# Patient Record
Sex: Male | Born: 2000 | Race: Black or African American | Hispanic: No | Marital: Single | Smoking: Former smoker
Health system: Southern US, Community
[De-identification: ages and names within clinical notes are randomized; demographics above are authoritative.]

---
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
## 2020-11-07 ENCOUNTER — Other Ambulatory Visit: Payer: Self-pay

## 2020-11-07 ENCOUNTER — Encounter: Payer: Self-pay | Admitting: Plastic Surgery

## 2020-11-07 ENCOUNTER — Ambulatory Visit (INDEPENDENT_AMBULATORY_CARE_PROVIDER_SITE_OTHER): Payer: BC Managed Care – PPO | Admitting: Plastic Surgery

## 2020-11-07 VITALS — BP 138/86 | HR 49 | Ht 68.0 in | Wt 182.0 lb

## 2020-11-07 DIAGNOSIS — L989 Disorder of the skin and subcutaneous tissue, unspecified: Secondary | ICD-10-CM

## 2020-11-07 NOTE — Progress Notes (Signed)
   Referring Provider Gary Ripple, MD [address],  [place] [address]   CC:  Chief Complaint  Patient presents with  . Advice Only      Gary Gould is an 20 y.o. male.  HPI: Patient presents to discuss a cystic lesion on his forehead.  He had felt small nodule in his central glabellar area for several months.  It spontaneously grew much larger and subsequently regressed.  There is some superficial skin changes and is interested in having it excised.  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  They felt that there was some persistent swelling after the and another plastic surgeon evaluated him and did scar excision but did not detect any evidence of lipoma recurrence.  There bothered by the scar and the swelling in that area as well  No Known Allergies  No outpatient encounter medications on file as of 11/07/2020.   No facility-administered encounter medications on file as of 11/07/2020.     No past medical history on file.  No family history on file.  Social History   Social History Narrative  . Not on file     Review of Systems General: Denies fevers, chills, weight loss CV: Denies chest pain, shortness of breath, palpitations  Physical Exam Vitals with BMI 11/07/2020  Height 5\' 8"   Weight 182 lbs  BMI 70.62  Systolic 376  Diastolic 86  Pulse 49    General:  No acute distress,  Alert and oriented, Non-Toxic, Normal speech and affect Examination shows 2-3 small nodules in the area of the glabella.  These are between a half a centimeter and a centimeter in size.  The overlying skin is very mildly erythematous and slightly atrophied.  They are not tender and I do not feel any fluctuance.  The right upper forehead there is a transverse scar that has widened.  4 mm.  There is a very mild surrounding swelling or prominence that extends at least 4 cm in diameter but this is fairly subtle.  Assessment/Plan I suspect he has  a cystic lesion in the glabella.  We discussed excision of this.  I recommended waiting another 4 to 6 weeks and hopefully there would be a little bit of additional regression which would make his scar smaller.  We discussed the risks include bleeding, infection, damage to surrounding structures and need for additional procedures.  We discussed potential for recurrence and the location and orientation of the scar.  Regarding the right upper forehead I went over some options of scar revision and dermabrasion and they are going to think about these.  Gary Gould 11/07/2020, 9:29 AM

## 2020-12-05 ENCOUNTER — Ambulatory Visit: Payer: BC Managed Care – PPO | Admitting: Plastic Surgery

## 2021-01-09 ENCOUNTER — Ambulatory Visit: Payer: BC Managed Care – PPO | Admitting: Plastic Surgery

## 2021-05-18 ENCOUNTER — Emergency Department (HOSPITAL_COMMUNITY): Payer: BC Managed Care – PPO

## 2021-05-18 ENCOUNTER — Other Ambulatory Visit: Payer: Self-pay

## 2021-05-18 ENCOUNTER — Encounter (HOSPITAL_COMMUNITY): Payer: Self-pay | Admitting: *Deleted

## 2021-05-18 ENCOUNTER — Emergency Department (HOSPITAL_COMMUNITY)
Admission: EM | Admit: 2021-05-18 | Discharge: 2021-05-18 | Disposition: A | Discharge status: Home / Self Care | Payer: BC Managed Care – PPO | Attending: Emergency Medicine | Admitting: Emergency Medicine

## 2021-05-18 DIAGNOSIS — F410 Panic disorder [episodic paroxysmal anxiety] without agoraphobia: Secondary | ICD-10-CM | POA: Insufficient documentation

## 2021-05-18 MED ORDER — LACTATED RINGERS IV BOLUS
1000.0000 mL | Freq: Once | INTRAVENOUS | Status: AC
  Administered 2021-05-18: 1000 mL via INTRAVENOUS

## 2021-05-18 NOTE — Discharge Instructions (Addendum)
Please follow-up with your PCP

## 2021-05-18 NOTE — ED Provider Notes (Signed)
[facility] EMERGENCY DEPARTMENT Provider Note   CSN: [id number] Arrival date & time: 05/18/21  1936     History Chief Complaint  Patient presents with   Panic Attack    Gary Gould is a 20 y.o. male.  HPI  Patient is a 20 year old male that is presenting for a panic attack. Patient endorses eating an edible two hours ago. He states that it was a rice krispy with THC that was made by one of his friends. He says that he went to dinner and became anxious. He left dinner and on his ride home began to get worried about eating the edible. He states that he felt like his heart was racing and he became short of breath. He became very anxious and called EMS. He states that this has not happened before. He denies any other drug use.   Patient denies any fevers, chills, headache, neck stiffness, chest pain, shortness of breath, nausea, vomiting, diarrhea, numbness or weakness.      History reviewed. No pertinent past medical history.  There are no problems to display for this patient.   History reviewed. No pertinent surgical history.     No family history on file.  Social History   Tobacco Use   Smoking status: Never   Smokeless tobacco: Never  Substance Use Topics   Alcohol use: Yes   Drug use: Yes    Types: Marijuana    Home Medications Prior to Admission medications   Medication Sig Start Date End Date Taking? Authorizing Provider  acetaminophen (TYLENOL) 325 MG tablet Take 650 mg by mouth every 6 (six) hours as needed for moderate pain or headache.   Yes [provider]  Multiple Vitamin (MULTIVITAMIN) tablet Take 1 tablet by mouth daily.   Yes [provider]  amoxicillin-clavulanate (AUGMENTIN) 875-125 MG tablet Take 1 tablet by mouth See admin instructions. Bid x 10 days Patient not taking: Reported on 05/18/2021 05/06/21   [provider]  ciprofloxacin-dexamethasone (CIPRODEX) OTIC suspension Place 2 drops into the  right ear See admin instructions. Bid x 7 days Patient not taking: Reported on 05/18/2021 05/07/21   [provider]    Allergies    Patient has no known allergies.  Review of Systems   Review of Systems  Constitutional:  Negative for chills, diaphoresis, fatigue and fever.  HENT:  Negative for congestion, dental problem, ear pain, facial swelling, hearing loss, nosebleeds, postnasal drip, rhinorrhea, sore throat and trouble swallowing.   Eyes:  Negative for photophobia, pain and visual disturbance.  Respiratory:  Negative for apnea, cough, choking, chest tightness, shortness of breath, wheezing and stridor.   Cardiovascular:  Negative for chest pain, palpitations and leg swelling.  Gastrointestinal:  Negative for abdominal distention, abdominal pain, constipation, diarrhea, nausea and vomiting.  Endocrine: Negative for polydipsia and polyuria.  Genitourinary:  Negative for difficulty urinating, dysuria, flank pain, frequency, hematuria and urgency.  Musculoskeletal:  Negative for gait problem, myalgias, neck pain and neck stiffness.  Skin:  Negative for rash and wound.  Allergic/Immunologic: Negative for environmental allergies and food allergies.  Neurological:  Negative for dizziness, tremors, seizures, syncope, facial asymmetry, speech difficulty, light-headedness, numbness and headaches.  Psychiatric/Behavioral:  Negative for behavioral problems and confusion. The patient is nervous/anxious.   All other systems reviewed and are negative.  Physical Exam Updated Vital Signs BP (!) 145/70 (BP Location: Right Arm)   Pulse 76   Temp 97.9 F (36.6 C)   Resp 18  Ht '5\' 8"'$  (1.727 m)   Wt 82.6 kg   SpO2 99%   BMI 27.69 kg/m   Physical Exam Vitals and nursing note reviewed.  Constitutional:      General: He is not in acute distress.    Appearance: Normal appearance. He is normal weight.  HENT:     Head: Normocephalic and atraumatic.     Right Ear: External ear normal.      Left Ear: External ear normal.     Nose: Nose normal. No congestion.     Mouth/Throat:     Mouth: Mucous membranes are moist.     Pharynx: Oropharynx is clear. No oropharyngeal exudate or posterior oropharyngeal erythema.  Eyes:     General: No visual field deficit.    Extraocular Movements: Extraocular movements intact.     Conjunctiva/sclera: Conjunctivae normal.     Pupils: Pupils are equal, round, and reactive to light.  Cardiovascular:     Rate and Rhythm: Normal rate and regular rhythm.     Pulses: Normal pulses.     Heart sounds: Normal heart sounds. No murmur heard.   No friction rub. No gallop.  Pulmonary:     Effort: Pulmonary effort is normal. No respiratory distress.     Breath sounds: Normal breath sounds. No stridor. No wheezing, rhonchi or rales.  Chest:     Chest wall: No tenderness.  Abdominal:     General: Abdomen is flat. Bowel sounds are normal. There is no distension.     Palpations: Abdomen is soft.     Tenderness: There is no abdominal tenderness. There is no right CVA tenderness, left CVA tenderness, guarding or rebound.  Musculoskeletal:        General: No swelling or tenderness. Normal range of motion.     Cervical back: Normal range of motion and neck supple. No rigidity, tenderness or bony tenderness.     Thoracic back: Normal. No tenderness or bony tenderness.     Lumbar back: Normal. No tenderness or bony tenderness.     Right lower leg: No edema.     Left lower leg: No edema.  Skin:    General: Skin is warm and dry.  Neurological:     General: No focal deficit present.     Mental Status: He is alert and oriented to person, place, and time. Mental status is at baseline.     Cranial Nerves: Cranial nerves are intact. No cranial nerve deficit, dysarthria or facial asymmetry.     Sensory: Sensation is intact. No sensory deficit.     Motor: Motor function is intact. No weakness.     Coordination: Coordination is intact. Finger-Nose-Finger Test normal.      Gait: Gait is intact. Gait normal.  Psychiatric:        Mood and Affect: Mood normal.        Behavior: Behavior normal.        Thought Content: Thought content normal.        Judgment: Judgment normal.    ED Results / Procedures / Treatments   Labs (all labs ordered are listed, but only abnormal results are displayed) Labs Reviewed - No data to display  EKG EKG Interpretation  Date/Time:  Saturday May 18 2021 22:07:05 EDT Ventricular Rate:  79 PR Interval:  176 QRS Duration: 103 QT Interval:  365 QTC Calculation: 419 R Axis:   97 Text Interpretation: Sinus rhythm Consider right ventricular hypertrophy  diffuse ST elevation, likely early repol Confirmed by Regenia Skeeter,  Scott [phone]) on 05/18/2021 10:17:38 PM Also confirmed by Sherwood Gambler [phone]), editor Mariam Dollar [phone]  on 05/19/2021 10:22:04 AM  Radiology DG Chest Portable 1 View  Result Date: 05/18/2021 CLINICAL DATA:  Shortness of breath, tachycardia, panic attack EXAM: PORTABLE CHEST 1 VIEW COMPARISON:  None. FINDINGS: The heart size and mediastinal contours are within normal limits. Both lungs are clear. The visualized skeletal structures are unremarkable. IMPRESSION: No active disease. Electronically Signed   By: Randa Ngo M.D.   On: 05/18/2021 20:56    Procedures Procedures   Medications Ordered in ED Medications  lactated ringers bolus 1,000 mL (0 mLs Intravenous Stopped 05/18/21 2328)    ED Course  I have reviewed the triage vital signs and the nursing notes.  Pertinent labs & imaging results that were available during my care of the patient were reviewed by me and considered in my medical decision making (see chart for details).    MDM Rules/Calculators/A&P                         Kody Weatherspoon is a 20 y.o. male that is presenting for a panic attack after eating a THC edible two hours ago. On arrival patient is tachycardic. He is in no acute distress. He states that he has not taken any other  drugs. He has been very anxious since eating the edible. He complained of palpitations and shortness of breath while worrying after eating the edible. His initial EKG showed sinus tachycardia. His cxr showed no acute cardiac or pulmonary abnormality. He was given IV fluids. On reevaluation his tachycardia resolved. His repeat EKG showed sinus rhythm. He states that his symptoms have resolved. His symptoms are most likely secondary to a panic attack after eating an edible. Patient is going to follow up with his PCP.  Patient states compliance and understanding of the plan. I explained labs and imaging to the patient. No further questions at this time from the patient.  The patient is safe and stable for discharge at this time with return precautions provided and a plan for follow-up care in place as needed  The plan for this patient was discussed with Dr. Regenia Skeeter, who voiced agreement and who oversaw evaluation and treatment of this patient.   Final Clinical Impression(s) / ED Diagnoses Final diagnoses:  Panic attack    Rx / DC Orders ED Discharge Orders     None        Doretha Sou, MD 05/19/21 1307    Sherwood Gambler, MD 05/23/21 [phone]

## 2021-05-18 NOTE — ED Triage Notes (Signed)
The pt ate a weed brownie 2 hours ago since then he has had tachycardia and is having a panic attack

## 2021-07-29 ENCOUNTER — Encounter: Payer: Self-pay | Admitting: Plastic Surgery

## 2021-07-29 ENCOUNTER — Ambulatory Visit: Payer: BC Managed Care – PPO | Admitting: Plastic Surgery

## 2021-07-29 ENCOUNTER — Other Ambulatory Visit: Payer: Self-pay

## 2021-07-29 VITALS — BP 164/96 | HR 66 | Ht 68.0 in | Wt 196.0 lb

## 2021-07-29 DIAGNOSIS — D489 Neoplasm of uncertain behavior, unspecified: Secondary | ICD-10-CM | POA: Diagnosis not present

## 2021-07-29 NOTE — Progress Notes (Signed)
Chief complaint: Cystic lesion forehead  HPI: The patient presents again for a cystic lesion of the central forehead.  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  The lesion was larger at one point and had gotten infected but has now progressed.  I believe when Dr. Claudia Desanctis saw him he wanted to wait to perform excision because the skin was larger and inflamed at that time and thought maybe it would decrease in size.  The lesion has decreased in size and the patient is now interested in surgical excision.  Physical exam BP (!) 164/96 (BP Location: Left Arm, Patient Position: Sitting, Cuff Size: Large)   Pulse 66   Ht 5\' 8"  (1.727 m)   Wt 196 lb (88.9 kg)   SpO2 100%   BMI 29.80 kg/m   General: Alert, no acute distress HEENT: 1.5 cm cystic lesion central forehead, very mobile.  Patient has some surrounding hyperpigmentation  Assessment and plan: Patient is a good candidate for excision of the lesion.  I explained to him that there is a possibility that he would have some hyperpigmentation associated with the scar.  He is interested in proceeding.  Time based coding: 10 minutes were spent with the patient.  Greater than 50% was spent on counseling cordination of care.

## 2021-08-16 ENCOUNTER — Encounter: Payer: Self-pay | Admitting: Plastic Surgery

## 2021-08-16 ENCOUNTER — Other Ambulatory Visit: Payer: Self-pay

## 2021-08-16 ENCOUNTER — Ambulatory Visit: Payer: BC Managed Care – PPO | Admitting: Plastic Surgery

## 2021-08-16 ENCOUNTER — Other Ambulatory Visit (HOSPITAL_COMMUNITY)
Admission: RE | Admit: 2021-08-16 | Discharge: 2021-08-16 | Disposition: A | Discharge status: Home / Self Care | Payer: BC Managed Care – PPO | Source: Ambulatory Visit | Attending: Plastic Surgery | Admitting: Plastic Surgery

## 2021-08-16 VITALS — BP 154/84 | HR 55 | Ht 68.0 in | Wt 190.0 lb

## 2021-08-16 DIAGNOSIS — D489 Neoplasm of uncertain behavior, unspecified: Secondary | ICD-10-CM | POA: Diagnosis present

## 2021-08-16 NOTE — Progress Notes (Signed)
Operative Note   DATE OF OPERATION: 08/16/2021  LOCATION:  Office procedure  SURGICAL DEPARTMENT: Plastic Surgery  PREOPERATIVE DIAGNOSES:  cystic lesion skin  POSTOPERATIVE DIAGNOSES:  same  PROCEDURE:  Excision of cystic skin lesion measuring 1.2cm Intermediate (layered) closure measuring 1.4 cm  SURGEON: Jil Penland P. Carrah Eppolito, MD  ANESTHESIA:  Local  COMPLICATIONS: None.   INDICATIONS FOR PROCEDURE:  The patient, Gary Gould is a 20 y.o. male born on [birth year], is here for treatment of cystic lesion forehead. MRN: [id number]  CONSENT:  Informed consent was obtained directly from the patient. Risks, benefits and alternatives were fully discussed. Specific risks including but not limited to bleeding, infection, hematoma, seroma, scarring, pain, infection, wound healing problems, and need for further surgery were all discussed. The patient did have an ample opportunity to have questions answered to satisfaction.   DESCRIPTION OF PROCEDURE:  Local anesthesia was administered. The patient's operative site was prepped and draped in a sterile fashion. A time out was performed and all information was confirmed to be correct.  The lesion was excised with a 15 blade.  Hemostasis was obtained with bovie electrocautery.  Circumferential undermining was performed and the skin was advanced and closed in layers with interrupted buried Monocryl sutures and 5-0 prolene for the skin.  The lesion excised measured 1.2 cm, and the total length of closure measured 1.4 cm.    The patient tolerated the procedure well.  There were no complications.

## 2021-08-19 LAB — SURGICAL PATHOLOGY

## 2021-08-26 ENCOUNTER — Ambulatory Visit: Payer: BC Managed Care – PPO | Admitting: Plastic Surgery

## 2021-08-26 ENCOUNTER — Other Ambulatory Visit: Payer: Self-pay

## 2021-08-26 DIAGNOSIS — L720 Epidermal cyst: Secondary | ICD-10-CM

## 2021-08-26 NOTE — Progress Notes (Signed)
Patient is status post excision of central forehead lesion.  He is doing well without complaints.  Physical exam Incision clean dry and intact.  Sutures intact  Pathology: Epidermal inclusion cyst  Assessment and plan: Sutures removed.  Patient will follow up if he has any concerns about scarring.

## 2022-07-29 IMAGING — DX DG CHEST 1V PORT
1 series · 1 of 1 positions shown · non-contrast
Comparison: None.

CLINICAL DATA: Shortness of breath, tachycardia, panic attack

EXAM:
PORTABLE CHEST 1 VIEW

[chest]
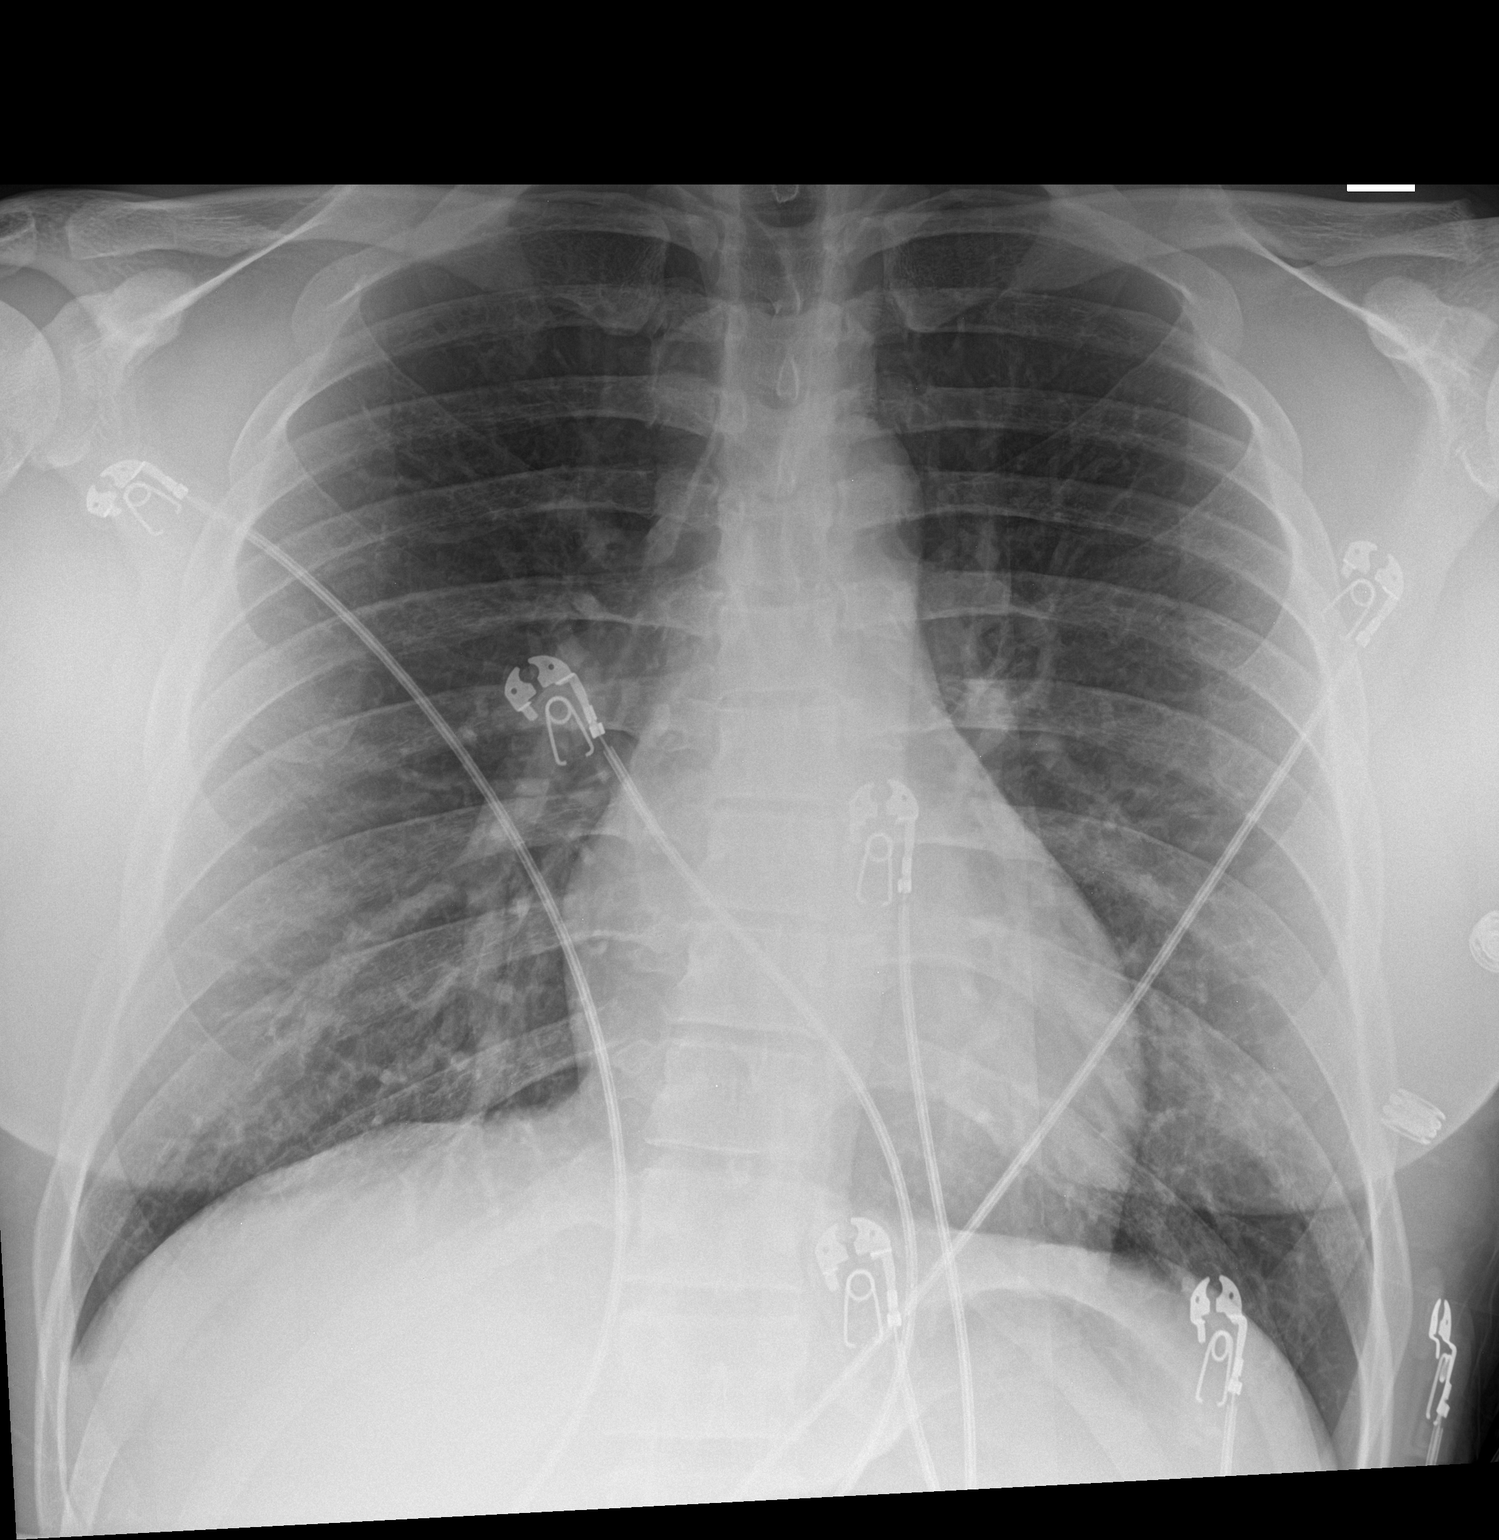

[1 of 1 positions shown; findings below may reference images not displayed]

FINDINGS: The heart size and mediastinal contours are within normal limits.
Both lungs are clear. The visualized skeletal structures are
unremarkable.
IMPRESSION: No active disease.
